# Patient Record
Sex: Male | Born: 1955 | Race: White | Hispanic: No | Marital: Married | Smoking: Never smoker
Health system: Southern US, Community
[De-identification: ages and names within clinical notes are randomized; demographics above are authoritative.]

## PROBLEM LIST (undated history)

## (undated) DIAGNOSIS — E7800 Pure hypercholesterolemia, unspecified: Secondary | ICD-10-CM

## (undated) DIAGNOSIS — I10 Essential (primary) hypertension: Secondary | ICD-10-CM

## (undated) DIAGNOSIS — J45909 Unspecified asthma, uncomplicated: Secondary | ICD-10-CM

## (undated) DIAGNOSIS — M5126 Other intervertebral disc displacement, lumbar region: Secondary | ICD-10-CM

## (undated) DIAGNOSIS — G4730 Sleep apnea, unspecified: Secondary | ICD-10-CM

## (undated) DIAGNOSIS — R7303 Prediabetes: Secondary | ICD-10-CM

## (undated) DIAGNOSIS — F32A Depression, unspecified: Secondary | ICD-10-CM

## (undated) DIAGNOSIS — M549 Dorsalgia, unspecified: Secondary | ICD-10-CM

## (undated) DIAGNOSIS — F329 Major depressive disorder, single episode, unspecified: Secondary | ICD-10-CM

## (undated) DIAGNOSIS — M1990 Unspecified osteoarthritis, unspecified site: Secondary | ICD-10-CM

## (undated) DIAGNOSIS — E079 Disorder of thyroid, unspecified: Secondary | ICD-10-CM

## (undated) HISTORY — PX: NASAL SEPTUM SURGERY: SHX37

## (undated) HISTORY — PX: KNEE SURGERY: SHX244

---

## 2016-10-07 ENCOUNTER — Emergency Department (HOSPITAL_BASED_OUTPATIENT_CLINIC_OR_DEPARTMENT_OTHER)
Admission: EM | Admit: 2016-10-07 | Discharge: 2016-10-07 | Disposition: A | Discharge status: Home / Self Care | Payer: BLUE CROSS/BLUE SHIELD | Attending: Emergency Medicine | Admitting: Emergency Medicine

## 2016-10-07 ENCOUNTER — Emergency Department (HOSPITAL_BASED_OUTPATIENT_CLINIC_OR_DEPARTMENT_OTHER): Payer: BLUE CROSS/BLUE SHIELD

## 2016-10-07 ENCOUNTER — Encounter (HOSPITAL_BASED_OUTPATIENT_CLINIC_OR_DEPARTMENT_OTHER): Payer: Self-pay

## 2016-10-07 DIAGNOSIS — X501XXA Overexertion from prolonged static or awkward postures, initial encounter: Secondary | ICD-10-CM | POA: Diagnosis not present

## 2016-10-07 DIAGNOSIS — J45909 Unspecified asthma, uncomplicated: Secondary | ICD-10-CM | POA: Diagnosis not present

## 2016-10-07 DIAGNOSIS — Y9301 Activity, walking, marching and hiking: Secondary | ICD-10-CM | POA: Insufficient documentation

## 2016-10-07 DIAGNOSIS — Y998 Other external cause status: Secondary | ICD-10-CM | POA: Diagnosis not present

## 2016-10-07 DIAGNOSIS — S8991XA Unspecified injury of right lower leg, initial encounter: Secondary | ICD-10-CM | POA: Diagnosis present

## 2016-10-07 DIAGNOSIS — I10 Essential (primary) hypertension: Secondary | ICD-10-CM | POA: Diagnosis not present

## 2016-10-07 DIAGNOSIS — Y929 Unspecified place or not applicable: Secondary | ICD-10-CM | POA: Insufficient documentation

## 2016-10-07 DIAGNOSIS — S8391XA Sprain of unspecified site of right knee, initial encounter: Secondary | ICD-10-CM | POA: Insufficient documentation

## 2016-10-07 HISTORY — DX: Disorder of thyroid, unspecified: E07.9

## 2016-10-07 HISTORY — DX: Major depressive disorder, single episode, unspecified: F32.9

## 2016-10-07 HISTORY — DX: Essential (primary) hypertension: I10

## 2016-10-07 HISTORY — DX: Depression, unspecified: F32.A

## 2016-10-07 HISTORY — DX: Pure hypercholesterolemia, unspecified: E78.00

## 2016-10-07 HISTORY — DX: Other intervertebral disc displacement, lumbar region: M51.26

## 2016-10-07 HISTORY — DX: Prediabetes: R73.03

## 2016-10-07 HISTORY — DX: Unspecified asthma, uncomplicated: J45.909

## 2016-10-07 HISTORY — DX: Sleep apnea, unspecified: G47.30

## 2016-10-07 HISTORY — DX: Dorsalgia, unspecified: M54.9

## 2016-10-07 HISTORY — DX: Unspecified osteoarthritis, unspecified site: M19.90

## 2016-10-07 NOTE — ED Notes (Signed)
c/o rt knee pain onset 3 week ago while kneeing at [employer]  Has had pain off and on since  Was seen by chiropractor w some relief,  Started up steps today and heard a pop in knee  Has had increased pain since

## 2016-10-07 NOTE — ED Notes (Signed)
ED Provider at bedside. 

## 2016-10-07 NOTE — ED Triage Notes (Signed)
Pt c/o "felt a pop" right knee going up stairs today-presents to triage in w/c-NAD

## 2016-10-07 NOTE — ED Provider Notes (Signed)
[facility] Note   CSN: [id number] Arrival date & time: 10/07/16  1906  By signing my name below, I, Benjamin Schmidt, attest that this documentation has been prepared under the direction and in the presence of Benjamin Spatesachel Morgan Welles Walthall, MD. Electronically Signed: Cynda AcresHailei Schmidt, Scribe. 10/07/16. 9:58 PM.  History   Chief Complaint Chief Complaint  Patient presents with  . Knee Pain    HPI Comments: Benjamin Schmidt is a 61 y.o. male with a history of HLD, HTN, DM, chronic back pain, and a left knee replacement, who presents to the Emergency Department complaining of sudden-onset, constant right knee pain that began yesterday. Patient states he walked up one stair today, when his right knee "popped". Patient reports associated posterior right calf pain that radiates up the posterior thigh. Patient reports taking ibuprofen with no relief. Patient is taking 325 mg aspirin daily.  Patient is ambulatory in the emergency department. Patient reports a left knee replacement  5 years ago. Patient denies any redness, radiation, left knee pain, fever, skin problems, nausea, or vomiting.   The history is provided by the patient. No language interpreter was used.    Past Medical History:  Diagnosis Date  . Arthritis   . Asthma   . Back pain   . Depression   . Herniated lumbar intervertebral disc   . High cholesterol   . Hypertension   . Prediabetes   . Sleep apnea   . Thyroid disease     There are no active problems to display for this patient.   Past Surgical History:  Procedure Laterality Date  . KNEE SURGERY    . NASAL SEPTUM SURGERY         Home Medications    Prior to Admission medications   Not on File    Family History No family history on file.  Social History Social History  Substance Use Topics  . Smoking status: Never Smoker  . Smokeless tobacco: Never Used  . Alcohol use Yes     Comment: occ     Allergies   Wellbutrin  [bupropion]   Review of Systems Review of Systems   10 Systems reviewed and all are negative for acute change except as noted in the HPI.  Physical Exam Updated Vital Signs BP 128/84 (BP Location: Left Arm)   Pulse 78   Temp 98.9 F (37.2 C) (Oral)   Resp 20   Ht 5\' 8"  (1.727 m)   Wt (!) 335 lb (152 kg)   SpO2 96%   BMI 50.94 kg/m   Physical Exam  Constitutional: He is oriented to person, place, and time. He appears well-developed and well-nourished. No distress.  HENT:  Head: Normocephalic and atraumatic.  Eyes: Conjunctivae are normal.  Neck: Normal range of motion. Neck supple.  Cardiovascular:  Intact distal pulses.  Musculoskeletal: Normal range of motion. He exhibits no edema, tenderness or deformity.  Tenderness along the medial joint line of the right knee with trace knee effusion. No joint laxity. Negative anterior and posterior drawer signs.   Neurological: He is alert and oriented to person, place, and time. No sensory deficit. He exhibits normal muscle tone.  Skin: Skin is warm and dry.  Psychiatric: He has a normal mood and affect. Judgment normal.  Nursing note and vitals reviewed.    ED Treatments / Results  DIAGNOSTIC STUDIES: Oxygen Saturation is 96% on RA, adequate by my interpretation.    COORDINATION OF CARE: 9:56 PM Discussed treatment plan with pt  at bedside and pt agreed to plan, which includes a right knee brace, crutches, and anti-inflammatory pain medication.  Labs (all labs ordered are listed, but only abnormal results are displayed) Labs Reviewed - No data to display  EKG  EKG Interpretation None       Radiology Dg Knee Complete 4 Views Right  Result Date: 10/07/2016 CLINICAL DATA:  Knee pain after going up the stairs EXAM: RIGHT KNEE - COMPLETE 4+ VIEW COMPARISON:  None. FINDINGS: No evidence of fracture, dislocation, or joint effusion. No evidence of arthropathy or other focal bone abnormality. Trace suprapatellar joint effusion  IMPRESSION: No acute osseous abnormality.  Trace suprapatellar joint effusion Electronically Signed   By: Jasmine Pang M.D.   On: 10/07/2016 20:04    Procedures Procedures (including critical care time)  Medications Ordered in ED Medications - No data to display   Initial Impression / Assessment and Plan / ED Course  I have reviewed the triage vital signs and the nursing notes.  Pertinent imaging results that were available during my care of the patient were reviewed by me and considered in my medical decision making (see chart for details).     Pt w/ R knee pain after pop, h/o osteoarthritis requiring L knee replacement in past. Trace effusion on XR. No joint laxity. Applied ACE wrap and gave crutches for comfort. No signs or symptoms to suggest infection especially given sudden onset with feeling of pop. Provided with orthopedics follow-up information for further evaluation if symptoms are improved in 1-2 weeks. Patient voiced understanding and was discharged in satisfactory condition.  Final Clinical Impressions(s) / ED Diagnoses   Final diagnoses:  Sprain of right knee, unspecified ligament, initial encounter    New Prescriptions There are no discharge medications for this patient.  I personally performed the services described in this documentation, which was scribed in my presence. The recorded information has been reviewed and is accurate.    Benjamin Spates, MD 10/07/16 [phone]

## 2016-10-11 ENCOUNTER — Ambulatory Visit (INDEPENDENT_AMBULATORY_CARE_PROVIDER_SITE_OTHER): Payer: BLUE CROSS/BLUE SHIELD | Admitting: Orthopaedic Surgery

## 2016-10-11 ENCOUNTER — Encounter (INDEPENDENT_AMBULATORY_CARE_PROVIDER_SITE_OTHER): Payer: Self-pay | Admitting: Orthopaedic Surgery

## 2016-10-11 DIAGNOSIS — M25561 Pain in right knee: Secondary | ICD-10-CM

## 2016-10-11 NOTE — Progress Notes (Signed)
Office Visit Note   Patient: Benjamin BinderJerry Schmidt           Date of Birth: May 03, 1956           MRN: [id number] Visit Date: 10/11/2016              Requested by: Tor NettersSara M. Kristen LoaderFurr, MD [address] [place], [place] [id number] PCP: Ninetta LightsFURR,SARA, MD   Assessment & Plan: Visit Diagnoses:  1. Acute pain of right knee     Plan: Impression is meniscal tear without effusion. Knee injection was performed today. I'll like to see him back in 2 weeks for recheck. If not better we'll consider MRI.  Follow-Up Instructions: Return in about 2 weeks (around 10/25/2016).   Orders:  No orders of the defined types were placed in this encounter.  No orders of the defined types were placed in this encounter.     Procedures: Large Joint Inj Date/Time: 10/11/2016 12:02 PM Performed by: Tarry KosXU, [place] M Authorized by: Tarry KosXU, Bettejane Leavens M   Consent Given by:  Patient Timeout: prior to procedure the correct patient, procedure, and site was verified   Indications:  Pain Location:  Knee Site:  R knee Prep: patient was prepped and draped in usual sterile fashion   Needle Size:  22 G Ultrasound Guidance: No   Fluoroscopic Guidance: No   Arthrogram: No   Patient tolerance:  Patient tolerated the procedure well with no immediate complications     Clinical Data: No additional findings.   Subjective: Chief Complaint  Patient presents with  . Right Knee - Pain    Patient is a 61 year old gentleman with right knee pain acutely for about 2 weeks. He took a step upstairs and felt an immediate pop and has had medial and lateral joint line pain. He denies any swelling. He has taken Advil with no relief. The pain does not radiate. He has seen a chiropractor with partial relief. The pain is occasional 6 out of 10.    Review of Systems  Constitutional: Negative.   All other systems reviewed and are negative.    Objective: Vital Signs: There were no vitals taken for this visit.  Physical Exam    Constitutional: He is oriented to person, place, and time. He appears well-developed and well-nourished.  HENT:  Head: Normocephalic and atraumatic.  Eyes: Pupils are equal, round, and reactive to light.  Neck: Neck supple.  Pulmonary/Chest: Effort normal.  Abdominal: Soft.  Musculoskeletal: Normal range of motion.  Neurological: He is alert and oriented to person, place, and time.  Skin: Skin is warm.  Psychiatric: He has a normal mood and affect. His behavior is normal. Judgment and thought content normal.  Nursing note and vitals reviewed.   Ortho Exam Exam of the right knee shows no joint effusion. He has medial and lateral joint line tenderness. Collaterals and cruciates are stable. He has mild pain with range of motion. Specialty Comments:  No specialty comments available.  Imaging: No results found.   PMFS History: There are no active problems to display for this patient.  Past Medical History:  Diagnosis Date  . Arthritis   . Asthma   . Back pain   . Depression   . Herniated lumbar intervertebral disc   . High cholesterol   . Hypertension   . Prediabetes   . Sleep apnea   . Thyroid disease     No family history on file.  Past Surgical History:  Procedure Laterality Date  .  KNEE SURGERY    . NASAL SEPTUM SURGERY     Social History   Occupational History  . Not on file.   Social History Main Topics  . Smoking status: Never Smoker  . Smokeless tobacco: Never Used  . Alcohol use Yes     Comment: occ  . Drug use: No  . Sexual activity: Not on file

## 2016-11-01 ENCOUNTER — Ambulatory Visit (INDEPENDENT_AMBULATORY_CARE_PROVIDER_SITE_OTHER): Payer: BLUE CROSS/BLUE SHIELD | Admitting: Orthopaedic Surgery

## 2016-11-01 DIAGNOSIS — M25561 Pain in right knee: Secondary | ICD-10-CM

## 2016-11-01 NOTE — Progress Notes (Signed)
   Office Visit Note   Patient: Benjamin Schmidt           Date of Birth: 20-Nov-1955           MRN: [id number] Visit Date: 11/01/2016              Requested by: Tor Netters. Kristen Loader, MD [address] [place], [place] [address] PCP: Ninetta Lights, MD   Assessment & Plan: Visit Diagnoses:  1. Acute pain of right knee     Plan: Patient has responded well to injection. I think his degenerative meniscal tear is improving appropriately. I would want him to give it at least 4 more weeks to see how he is doing. Please not completely better by then he should give [place] a call and we will get an MRI at that point to rule out meniscal tear of the right knee.  Follow-Up Instructions: Return if symptoms worsen or fail to improve.   Orders:  No orders of the defined types were placed in this encounter.  No orders of the defined types were placed in this encounter.     Procedures: No procedures performed   Clinical Data: No additional findings.   Subjective: Chief Complaint  Patient presents with  . Right Knee - Pain, Follow-up    Patient comes in today 2-3 weeks status post right knee injection. This is helped quite a bit. His pain is mainly in the posterior aspect of the knee. His medial joint line pain is gone.      Review of Systems   Objective: Vital Signs: There were no vitals taken for this visit.  Physical Exam  Ortho Exam Right knee exam shows no joint effusion. Medial joint line is no longer tender. He is referred pain in the posterior aspect of the knee. Specialty Comments:  No specialty comments available.  Imaging: No results found.   PMFS History: There are no active problems to display for this patient.  Past Medical History:  Diagnosis Date  . Arthritis   . Asthma   . Back pain   . Depression   . Herniated lumbar intervertebral disc   . High cholesterol   . Hypertension   . Prediabetes   . Sleep apnea   . Thyroid disease     No family history on  file.  Past Surgical History:  Procedure Laterality Date  . KNEE SURGERY    . NASAL SEPTUM SURGERY     Social History   Occupational History  . Not on file.   Social History Main Topics  . Smoking status: Never Smoker  . Smokeless tobacco: Never Used  . Alcohol use Yes     Comment: occ  . Drug use: No  . Sexual activity: Not on file

## 2016-12-23 ENCOUNTER — Ambulatory Visit (INDEPENDENT_AMBULATORY_CARE_PROVIDER_SITE_OTHER): Payer: BLUE CROSS/BLUE SHIELD | Admitting: Orthopaedic Surgery

## 2016-12-23 ENCOUNTER — Encounter (INDEPENDENT_AMBULATORY_CARE_PROVIDER_SITE_OTHER): Payer: Self-pay | Admitting: Orthopaedic Surgery

## 2016-12-23 DIAGNOSIS — M25561 Pain in right knee: Secondary | ICD-10-CM

## 2016-12-23 NOTE — Progress Notes (Signed)
   Office Visit Note   Patient: Benjamin BinderJerry Schmidt           Date of Birth: 1956/03/10           MRN: [id number] Visit Date: 12/23/2016              Requested by: Laqueta DueFurr, Sara M., MD [address] [place], [place] [id number] PCP: Laqueta DueFurr, Sara M., MD   Assessment & Plan: Visit Diagnoses:  1. Acute pain of right knee     Plan: Impression is right knee contusion. Recommend continue symptomatically treatment. He is ambulating well.  Follow-Up Instructions: Return if symptoms worsen or fail to improve.   Orders:  No orders of the defined types were placed in this encounter.  No orders of the defined types were placed in this encounter.     Procedures: No procedures performed   Clinical Data: No additional findings.   Subjective: Chief Complaint  Patient presents with  . Right Knee - Pain    Patient comes in today for a new injury of right knee pain for about 2 weeks. He fell off stairs 2 weeks ago. His pain has gotten better overall. He is able to ambulate without any significant difficulty or pain. His pain is 1 out of 10. Occasionally the pain will radiate down into the foot. He has bruising and swelling.    Review of Systems  Constitutional: Negative.   All other systems reviewed and are negative.    Objective: Vital Signs: There were no vitals taken for this visit.  Physical Exam  Constitutional: He is oriented to person, place, and time. He appears well-developed and well-nourished.  Pulmonary/Chest: Effort normal.  Abdominal: Soft.  Neurological: He is alert and oriented to person, place, and time.  Skin: Skin is warm.  Psychiatric: He has a normal mood and affect. His behavior is normal. Judgment and thought content normal.  Nursing note and vitals reviewed.   Ortho Exam Right knee exam shows no joint effusion. He does have mild ecchymosis. Collaterals and cruciates are stable. Overall exam is benign other than the ecchymosis. He does have mild  tenderness over the ecchymosis and laterally. Specialty Comments:  No specialty comments available.  Imaging: No results found.   PMFS History: There are no active problems to display for this patient.  Past Medical History:  Diagnosis Date  . Arthritis   . Asthma   . Back pain   . Depression   . Herniated lumbar intervertebral disc   . High cholesterol   . Hypertension   . Prediabetes   . Sleep apnea   . Thyroid disease     No family history on file.  Past Surgical History:  Procedure Laterality Date  . KNEE SURGERY    . NASAL SEPTUM SURGERY     Social History   Occupational History  . Not on file.   Social History Main Topics  . Smoking status: Never Smoker  . Smokeless tobacco: Never Used  . Alcohol use Yes     Comment: occ  . Drug use: No  . Sexual activity: Not on file

## 2019-02-02 ENCOUNTER — Other Ambulatory Visit: Payer: Self-pay

## 2019-02-02 DIAGNOSIS — Z20822 Contact with and (suspected) exposure to covid-19: Secondary | ICD-10-CM

## 2019-02-08 LAB — NOVEL CORONAVIRUS, NAA: SARS-CoV-2, NAA: NOT DETECTED

## 2019-02-18 IMAGING — CR DG KNEE COMPLETE 4+V*R*
4 series · 4 of 4 positions shown · non-contrast
Comparison: None.

CLINICAL DATA: Knee pain after going up the stairs

EXAM:
RIGHT KNEE - COMPLETE 4+ VIEW

[t knee ap right]
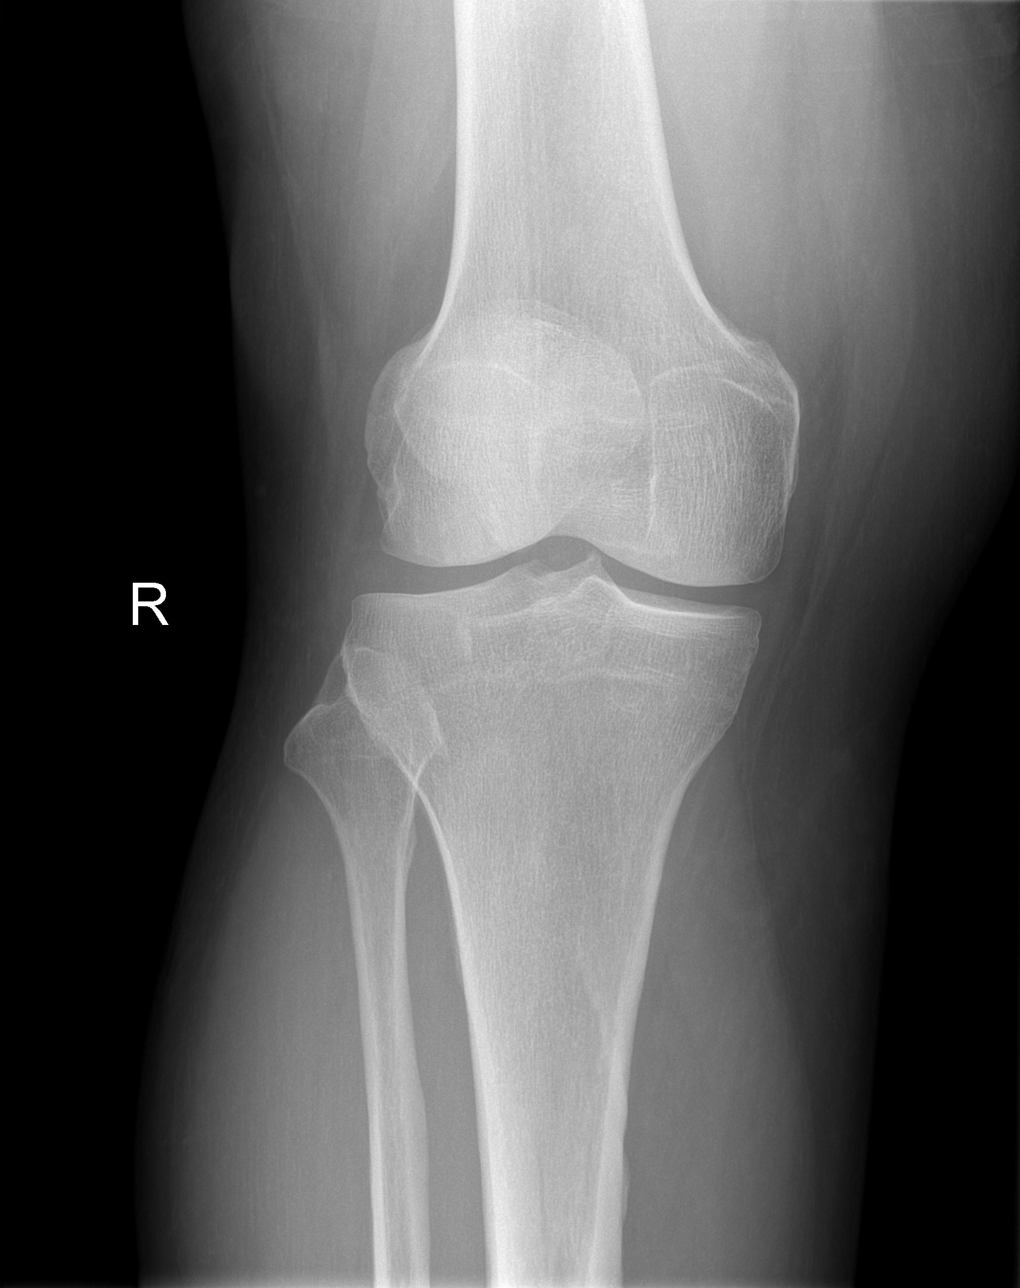

[t knee oblique right (1 of 2)]
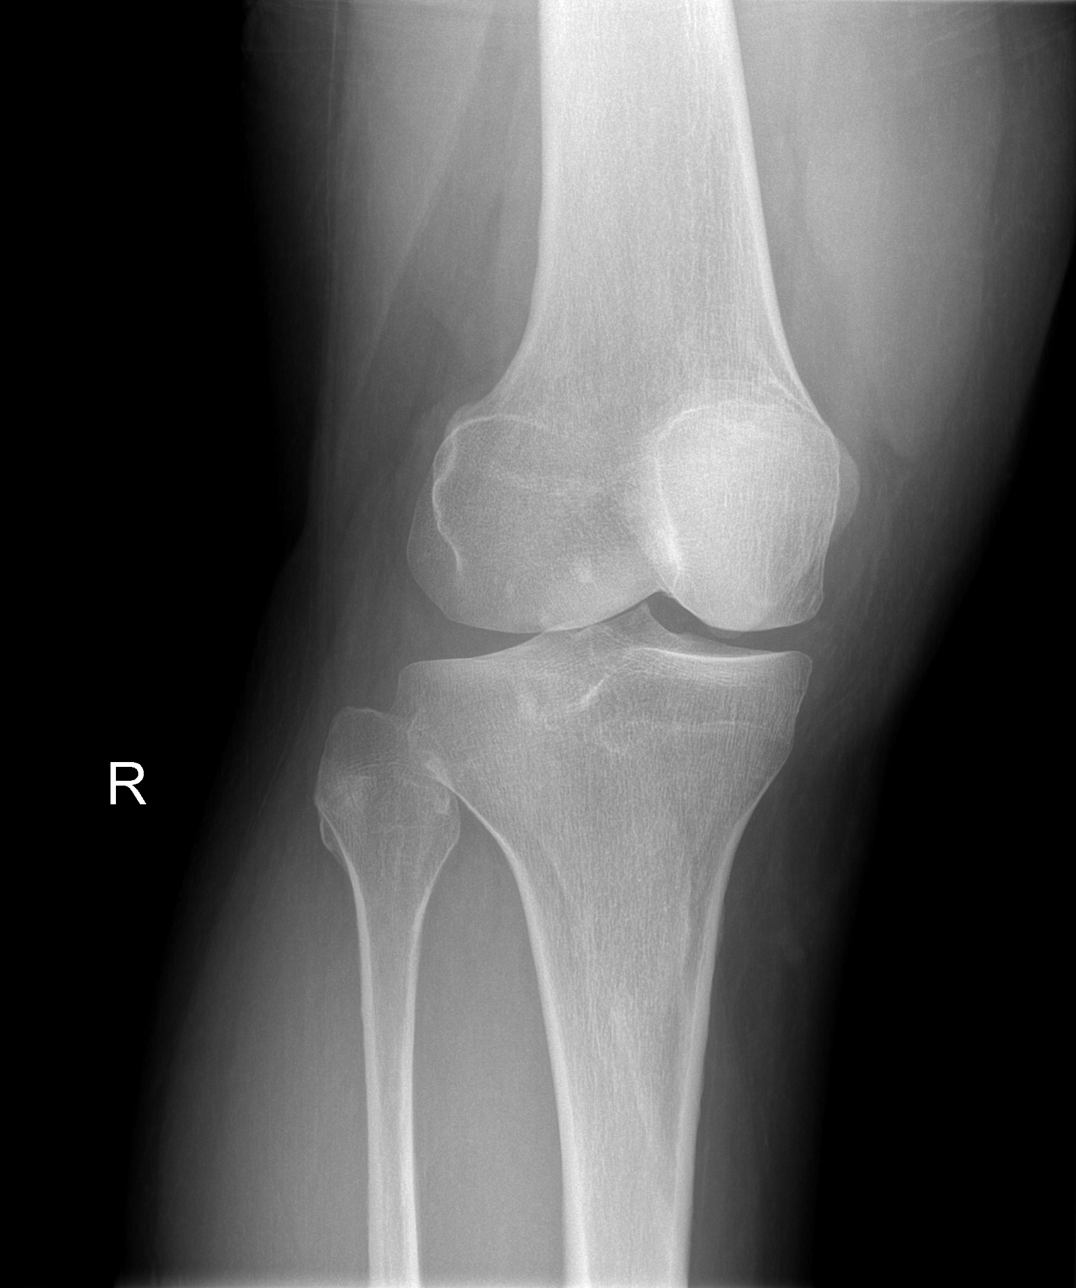

[t knee oblique right (2 of 2)]
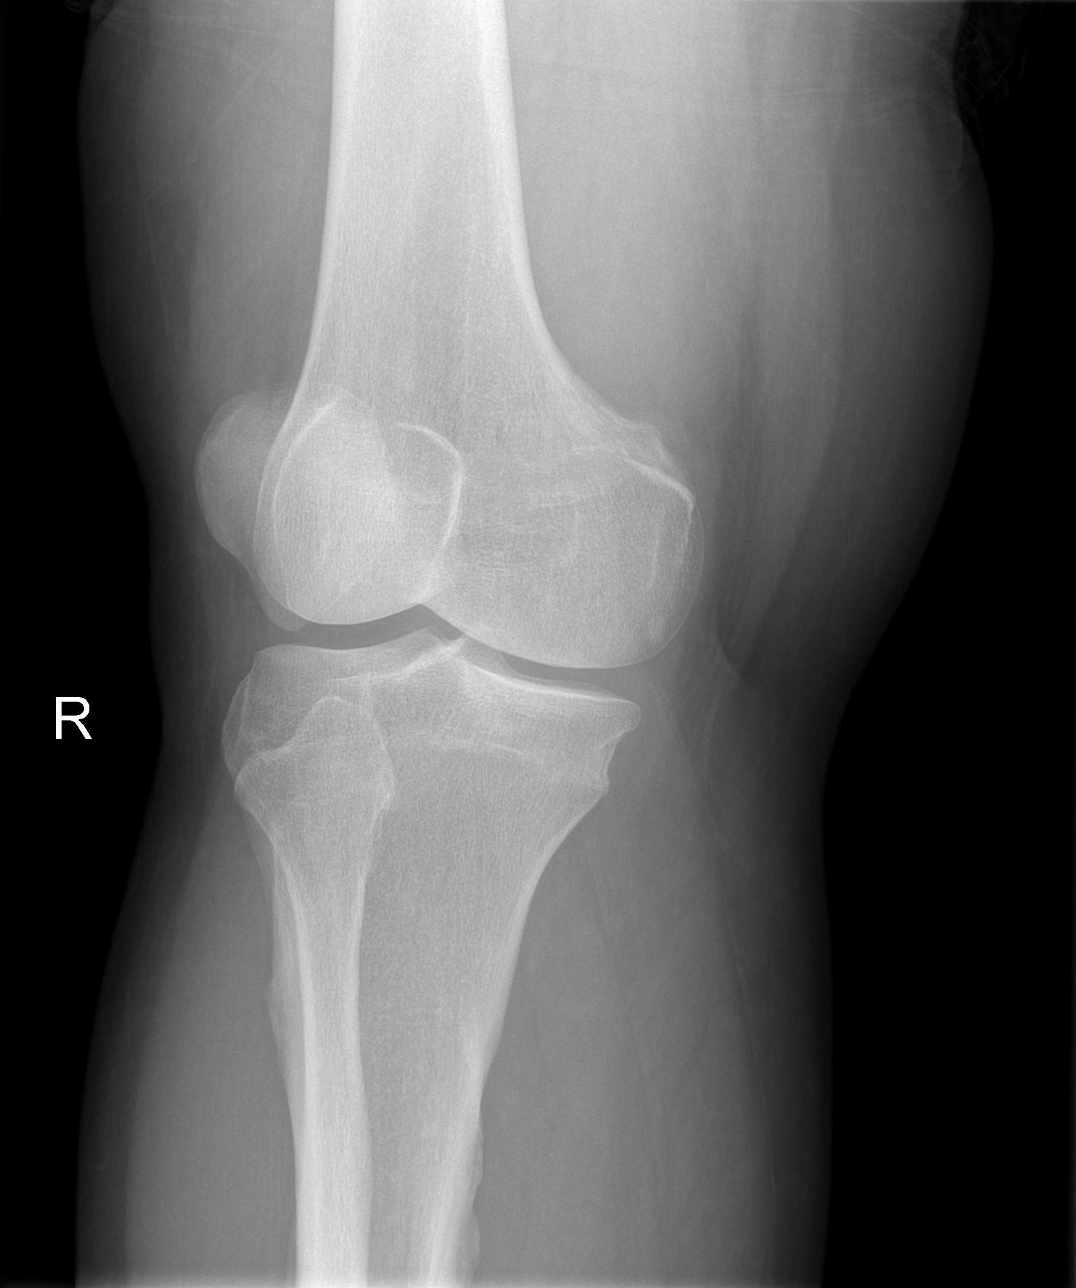

[t knee lat right]
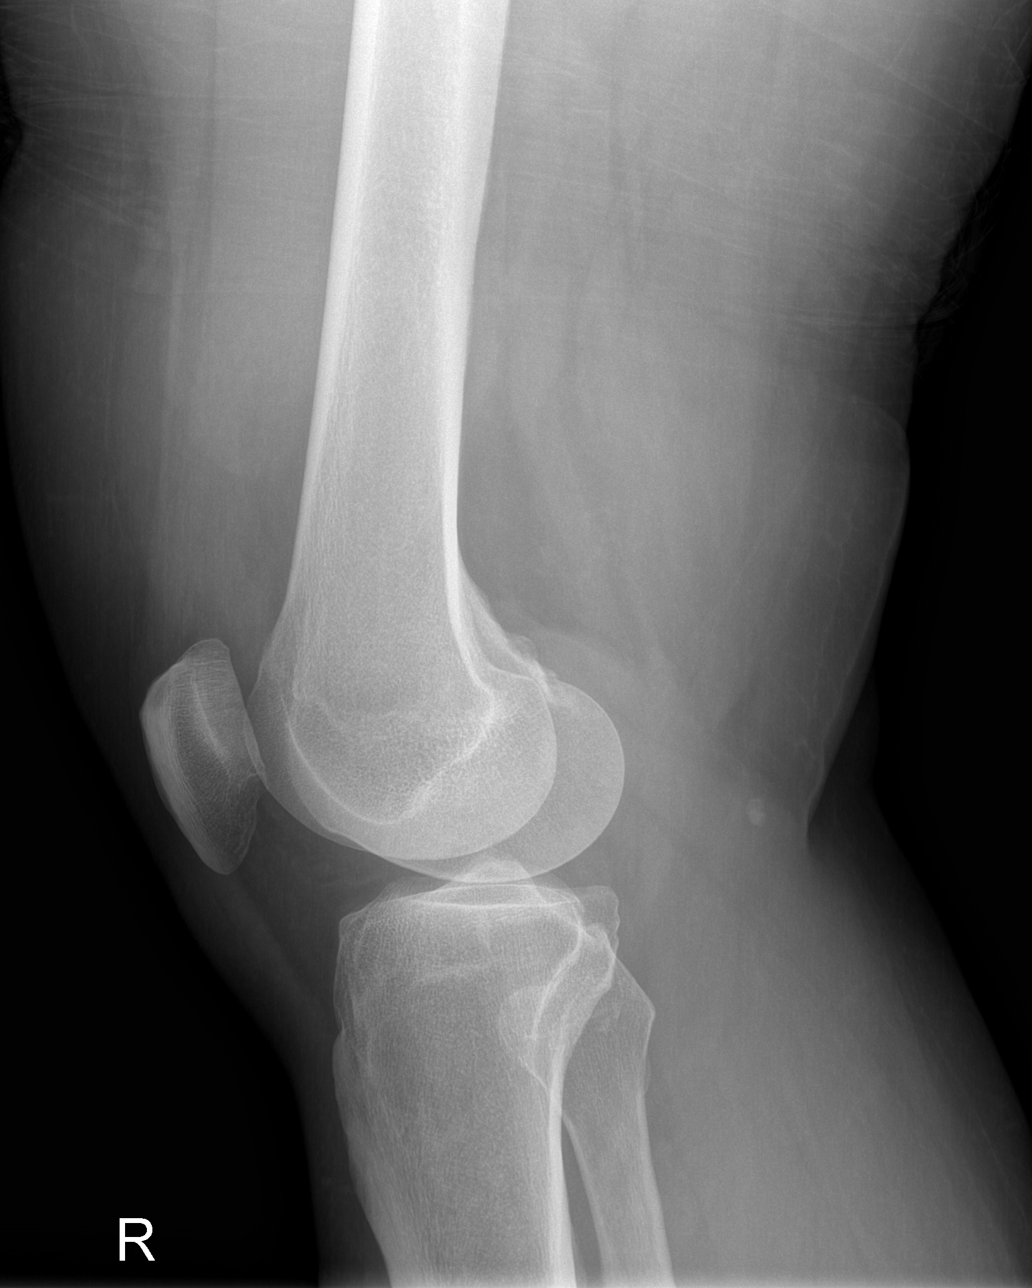

[4 of 4 positions shown; findings below may reference images not displayed]

FINDINGS: No evidence of fracture, dislocation, or joint effusion. No evidence
of arthropathy or other focal bone abnormality. Trace suprapatellar
joint effusion
IMPRESSION: No acute osseous abnormality.  Trace suprapatellar joint effusion
